# Patient Record
Sex: Female | Born: 1935 | Race: White | Hispanic: No | Smoking: Never smoker
Health system: Southern US, Community
[De-identification: ages and names within clinical notes are randomized; demographics above are authoritative.]

## PROBLEM LIST (undated history)

## (undated) DIAGNOSIS — H269 Unspecified cataract: Secondary | ICD-10-CM

## (undated) DIAGNOSIS — K2270 Barrett's esophagus without dysplasia: Secondary | ICD-10-CM

## (undated) DIAGNOSIS — I10 Essential (primary) hypertension: Secondary | ICD-10-CM

## (undated) DIAGNOSIS — G4730 Sleep apnea, unspecified: Secondary | ICD-10-CM

## (undated) DIAGNOSIS — E119 Type 2 diabetes mellitus without complications: Secondary | ICD-10-CM

## (undated) DIAGNOSIS — I509 Heart failure, unspecified: Secondary | ICD-10-CM

## (undated) DIAGNOSIS — L0390 Cellulitis, unspecified: Secondary | ICD-10-CM

## (undated) HISTORY — PX: AORTIC VALVE REPLACEMENT: SHX41

## (undated) HISTORY — PX: APPENDECTOMY: SHX54

## (undated) HISTORY — PX: KNEE SURGERY: SHX244

## (undated) HISTORY — PX: ABDOMINAL HYSTERECTOMY: SHX81

## (undated) HISTORY — PX: CHOLECYSTECTOMY: SHX55

---

## 2007-11-16 ENCOUNTER — Encounter: Admission: RE | Admit: 2007-11-16 | Discharge: 2007-11-16 | Payer: Self-pay | Admitting: Ophthalmology

## 2014-08-13 ENCOUNTER — Other Ambulatory Visit: Payer: Self-pay | Admitting: Internal Medicine

## 2016-07-14 ENCOUNTER — Emergency Department (HOSPITAL_BASED_OUTPATIENT_CLINIC_OR_DEPARTMENT_OTHER)
Admission: EM | Admit: 2016-07-14 | Discharge: 2016-07-14 | Disposition: A | Discharge status: Home / Self Care | Payer: Medicare Other | Attending: Emergency Medicine | Admitting: Emergency Medicine

## 2016-07-14 ENCOUNTER — Encounter (HOSPITAL_BASED_OUTPATIENT_CLINIC_OR_DEPARTMENT_OTHER): Payer: Self-pay | Admitting: *Deleted

## 2016-07-14 DIAGNOSIS — L03115 Cellulitis of right lower limb: Secondary | ICD-10-CM | POA: Insufficient documentation

## 2016-07-14 DIAGNOSIS — R21 Rash and other nonspecific skin eruption: Secondary | ICD-10-CM | POA: Diagnosis present

## 2016-07-14 DIAGNOSIS — L03116 Cellulitis of left lower limb: Secondary | ICD-10-CM | POA: Insufficient documentation

## 2016-07-14 DIAGNOSIS — Z7982 Long term (current) use of aspirin: Secondary | ICD-10-CM | POA: Diagnosis not present

## 2016-07-14 DIAGNOSIS — Z7984 Long term (current) use of oral hypoglycemic drugs: Secondary | ICD-10-CM | POA: Diagnosis not present

## 2016-07-14 DIAGNOSIS — I509 Heart failure, unspecified: Secondary | ICD-10-CM | POA: Insufficient documentation

## 2016-07-14 DIAGNOSIS — E119 Type 2 diabetes mellitus without complications: Secondary | ICD-10-CM | POA: Insufficient documentation

## 2016-07-14 DIAGNOSIS — Z79899 Other long term (current) drug therapy: Secondary | ICD-10-CM | POA: Diagnosis not present

## 2016-07-14 DIAGNOSIS — I110 Hypertensive heart disease with heart failure: Secondary | ICD-10-CM | POA: Insufficient documentation

## 2016-07-14 DIAGNOSIS — L03119 Cellulitis of unspecified part of limb: Secondary | ICD-10-CM

## 2016-07-14 HISTORY — DX: Heart failure, unspecified: I50.9

## 2016-07-14 HISTORY — DX: Barrett's esophagus without dysplasia: K22.70

## 2016-07-14 HISTORY — DX: Unspecified cataract: H26.9

## 2016-07-14 HISTORY — DX: Cellulitis, unspecified: L03.90

## 2016-07-14 HISTORY — DX: Essential (primary) hypertension: I10

## 2016-07-14 HISTORY — DX: Type 2 diabetes mellitus without complications: E11.9

## 2016-07-14 HISTORY — DX: Sleep apnea, unspecified: G47.30

## 2016-07-14 LAB — CBC WITH DIFFERENTIAL/PLATELET
BASOS PCT: 0 %
Basophils Absolute: 0 10*3/uL (ref 0.0–0.1)
EOS ABS: 0 10*3/uL (ref 0.0–0.7)
Eosinophils Relative: 0 %
HCT: 42.2 % (ref 36.0–46.0)
HEMOGLOBIN: 14.1 g/dL (ref 12.0–15.0)
Lymphocytes Relative: 8 %
Lymphs Abs: 0.8 10*3/uL (ref 0.7–4.0)
MCH: 29.6 pg (ref 26.0–34.0)
MCHC: 33.4 g/dL (ref 30.0–36.0)
MCV: 88.7 fL (ref 78.0–100.0)
MONOS PCT: 8 %
Monocytes Absolute: 0.7 10*3/uL (ref 0.1–1.0)
NEUTROS PCT: 84 %
Neutro Abs: 7.9 10*3/uL — ABNORMAL HIGH (ref 1.7–7.7)
PLATELETS: 242 10*3/uL (ref 150–400)
RBC: 4.76 MIL/uL (ref 3.87–5.11)
RDW: 15.6 % — ABNORMAL HIGH (ref 11.5–15.5)
WBC: 9.5 10*3/uL (ref 4.0–10.5)

## 2016-07-14 LAB — BASIC METABOLIC PANEL
Anion gap: 8 (ref 5–15)
BUN: 19 mg/dL (ref 6–20)
CALCIUM: 10 mg/dL (ref 8.9–10.3)
CO2: 27 mmol/L (ref 22–32)
CREATININE: 0.88 mg/dL (ref 0.44–1.00)
Chloride: 96 mmol/L — ABNORMAL LOW (ref 101–111)
GFR calc non Af Amer: 58 mL/min — ABNORMAL LOW (ref >60)
Glucose, Bld: 294 mg/dL — ABNORMAL HIGH (ref 65–99)
Potassium: 4.3 mmol/L (ref 3.5–5.1)
SODIUM: 131 mmol/L — AB (ref 135–145)

## 2016-07-14 MED ORDER — DEXTROSE 5 % IV SOLN
1.0000 g | Freq: Once | INTRAVENOUS | Status: AC
  Administered 2016-07-14: 1 g via INTRAVENOUS
  Filled 2016-07-14: qty 10

## 2016-07-14 MED ORDER — CEPHALEXIN 500 MG PO CAPS: 1000.0000 mg | ORAL_CAPSULE | Freq: Two times a day (BID) | ORAL | 0 refills | Status: AC

## 2016-07-14 MED FILL — CEPHALEXIN 500 MG CAPSULE: 500 | 7 days supply | Qty: 28 | Fill #0

## 2016-07-14 NOTE — ED Provider Notes (Signed)
[facility] Provider Note   CSN: [id number] Arrival date & time: 07/14/16  1038     History   Chief Complaint Chief Complaint  Patient presents with  . Rash    HPI Kathryn Richards is a 82 y.o. female.  HPI Patient reports she started developing increased redness of her lower legs 3 days ago. She reports she chronically has lower shotty swelling and some redness around her lower legs above the ankles. Patient has history of chronic venous stasis. She reports she saw her primary provider last Friday and the redness encompassed more of her right leg. She was started on doxycycline. Patient reports today the redness had started developing on the left lower leg as well. She was seen at the nurse care clinic and referred to the emergency department. Patient does not have pain with this. No itching. She reports that the person who checked her today thought she also had rash on her lower abdomen. Patient reports she is very careful with the care of her lower abdomen and pannus folds and has not noted any changes and no pain or itching. Patient denies any generalized constitutional symptoms. No fevers, no chills, no nausea or vomiting. Patient poor she is chronically short of breath due to congestive heart failure. She reports she has been no change in any degree of shortness of breath over the past weekend her week. Past Medical History:  Diagnosis Date  . Barrett esophagus   . Cataract   . Cellulitis   . CHF (congestive heart failure) (HCC)   . Diabetes mellitus without complication (HCC)   . Hypertension   . Sleep apnea     There are no active problems to display for this patient.   Past Surgical History:  Procedure Laterality Date  . ABDOMINAL HYSTERECTOMY    . AORTIC VALVE REPLACEMENT    . APPENDECTOMY    . CHOLECYSTECTOMY    . KNEE SURGERY      OB History    No data available       Home Medications    Prior to Admission medications   Medication Sig Start Date  End Date Taking? Authorizing Provider  Aspirin (ASPIR-81 PO) Take by mouth.   Yes Historical Provider, MD  cholecalciferol (VITAMIN D) 1000 units tablet Take 1,000 Units by mouth daily.   Yes Historical Provider, MD  GLIPIZIDE PO Take by mouth.   Yes Historical Provider, MD  losartan (COZAAR) 50 MG tablet Take 50 mg by mouth daily.   Yes Historical Provider, MD  METFORMIN HCL PO Take by mouth.   Yes Historical Provider, MD  metoprolol succinate (TOPROL-XL) 50 MG 24 hr tablet Take 50 mg by mouth daily. Take with or immediately following a meal.   Yes Historical Provider, MD  Multiple Vitamin (MULTIVITAMIN) tablet Take 1 tablet by mouth daily.   Yes Historical Provider, MD  nisoldipine (SULAR) 25.5 MG 24 hr tablet Take 25.5 mg by mouth daily.   Yes Historical Provider, MD  pantoprazole (PROTONIX) 40 MG tablet Take 40 mg by mouth daily.   Yes Historical Provider, MD  rosuvastatin (CRESTOR) 10 MG tablet Take 10 mg by mouth daily.   Yes Historical Provider, MD  SITagliptin Phosphate (JANUVIA PO) Take by mouth.   Yes Historical Provider, MD  spironolactone (ALDACTONE) 25 MG tablet Take 25 mg by mouth daily.   Yes Historical Provider, MD  torsemide (DEMADEX) 20 MG tablet Take 20 mg by mouth daily.   Yes Historical Provider, MD  cephALEXin (  KEFLEX) 500 MG capsule Take 2 capsules (1,000 mg total) by mouth 2 (two) times daily. 07/14/16   Arby BarretteMarcy Loy Mccartt, MD  clopidogrel (PLAVIX) 75 MG tablet TAKE 1 TABLET DAILY 08/14/14   Sharon SellerJessica K Eubanks, NP    Family History No family history on file.  Social History Social History  Substance Use Topics  . Smoking status: Never Smoker  . Smokeless tobacco: Never Used  . Alcohol use No     Allergies   Bactrim [sulfamethoxazole-trimethoprim]; Cortisone; Morphine and related; and Norvasc [amlodipine besylate]   Review of Systems Review of Systems 10 Systems reviewed and are negative for acute change except as noted in the HPI.   Physical Exam Updated Vital  Signs BP 113/61   Pulse 92   Temp 97.8 F (36.6 C) (Oral)   Resp 22   Ht 5\' 4"  (1.626 m)   Wt (!) 313 lb (142 kg)   SpO2 93%   BMI 53.73 kg/m   Physical Exam  Constitutional: She is oriented to person, place, and time.  Patient is alert and nontoxic. Mental status is clear. Morbid obesity.  HENT:  Head: Normocephalic and atraumatic.  Eyes: EOM are normal.  Cardiovascular: Normal rate, regular rhythm, normal heart sounds and intact distal pulses.   Pulmonary/Chest: Effort normal and breath sounds normal.  Abdominal:  Patient's abdomen is nontender. Examination of the pannus folds shows them to be in good condition. There is no maceration, erythema, discharge or rash.  Musculoskeletal:  Patient has 2+ pitting edema bilateral lower extremity's. The skin changes and cobbling consistent with chronic venous stasis. No open wounds or serous exudate at this time. Patient does have diffuse erythema from above the ankles to the high, lower leg. This is blanching.  Neurological: She is alert and oriented to person, place, and time. She exhibits normal muscle tone. Coordination normal.  Skin: Skin is warm and dry.  Psychiatric: She has a normal mood and affect.     ED Treatments / Results  Labs (all labs ordered are listed, but only abnormal results are displayed) Labs Reviewed  BASIC METABOLIC PANEL - Abnormal; Notable for the following:       Result Value   Sodium 131 (*)    Chloride 96 (*)    Glucose, Bld 294 (*)    GFR calc non Af Amer 58 (*)    All other components within normal limits  CBC WITH DIFFERENTIAL/PLATELET - Abnormal; Notable for the following:    RDW 15.6 (*)    Neutro Abs 7.9 (*)    All other components within normal limits    EKG  EKG Interpretation None       Radiology No results found.  Procedures Procedures (including critical care time)  Medications Ordered in ED Medications  cefTRIAXone (ROCEPHIN) 1 g in dextrose 5 % 50 mL IVPB (0 g  Intravenous Stopped 07/14/16 1216)     Initial Impression / Assessment and Plan / ED Course  I have reviewed the triage vital signs and the nursing notes.  Pertinent labs & imaging results that were available during my care of the patient were reviewed by me and considered in my medical decision making (see chart for details).  Clinical Course     Final Clinical Impressions(s) / ED Diagnoses   Final diagnoses:  Cellulitis of lower extremity, unspecified laterality  Patient has underlying chronic venous stasis with some degree of chronic erythema. By history this is spread from being a bandlike concentrated area just above the  ankles to come to sitting most of the lower leg on the right and now having started on the left. Clinically, the patient is in good condition. She is alert and appropriate. Vital signs are stable. No fever, leukocytosis or symptoms of general malaise. At this time, she has been given one IV dose of Rocephin and Keflex will be added to her antibiotic. I will have the patient continue her doxycycline as prescribed. No evidence of allergic reaction.  New Prescriptions New Prescriptions   CEPHALEXIN (KEFLEX) 500 MG CAPSULE    Take 2 capsules (1,000 mg total) by mouth 2 (two) times daily.     Arby BarretteMarcy Aryaa Bunting, MD 07/14/16 [phone]

## 2016-07-14 NOTE — ED Triage Notes (Signed)
Rash on both of her lower legs. It started on her right leg 3 days ago. She was started on Doxycycline. Rash on the left leg the 2 days ago. She now has a rash on her abdomen. No itching.

## 2016-07-14 NOTE — ED Notes (Signed)
ED Provider at bedside. 

## 2017-05-15 ENCOUNTER — Inpatient Hospital Stay
Admission: EM | Admit: 2017-05-15 | Discharge: 2017-06-11 | Disposition: E | Payer: Medicare Other | Source: Other Acute Inpatient Hospital | Attending: Internal Medicine | Admitting: Internal Medicine

## 2017-05-15 DIAGNOSIS — J9690 Respiratory failure, unspecified, unspecified whether with hypoxia or hypercapnia: Secondary | ICD-10-CM

## 2017-05-15 DIAGNOSIS — Z4659 Encounter for fitting and adjustment of other gastrointestinal appliance and device: Secondary | ICD-10-CM

## 2017-05-15 DIAGNOSIS — J111 Influenza due to unidentified influenza virus with other respiratory manifestations: Secondary | ICD-10-CM

## 2017-05-15 DIAGNOSIS — Z452 Encounter for adjustment and management of vascular access device: Secondary | ICD-10-CM

## 2017-05-15 DIAGNOSIS — I509 Heart failure, unspecified: Secondary | ICD-10-CM

## 2017-05-15 DIAGNOSIS — J189 Pneumonia, unspecified organism: Secondary | ICD-10-CM

## 2017-05-15 DIAGNOSIS — J9601 Acute respiratory failure with hypoxia: Secondary | ICD-10-CM

## 2017-05-15 DIAGNOSIS — Z978 Presence of other specified devices: Secondary | ICD-10-CM

## 2017-05-15 LAB — CBC WITH DIFFERENTIAL/PLATELET
BASOS ABS: 0 10*3/uL (ref 0.0–0.1)
Basophils Relative: 0 %
EOS ABS: 0.2 10*3/uL (ref 0.0–0.7)
Eosinophils Relative: 2 %
HCT: 40.8 % (ref 36.0–46.0)
HEMOGLOBIN: 12.5 g/dL (ref 12.0–15.0)
LYMPHS ABS: 0.6 10*3/uL — AB (ref 0.7–4.0)
Lymphocytes Relative: 6 %
MCH: 27.6 pg (ref 26.0–34.0)
MCHC: 30.6 g/dL (ref 30.0–36.0)
MCV: 90.1 fL (ref 78.0–100.0)
Monocytes Absolute: 1.1 10*3/uL — ABNORMAL HIGH (ref 0.1–1.0)
Monocytes Relative: 11 %
Neutro Abs: 7.8 10*3/uL — ABNORMAL HIGH (ref 1.7–7.7)
Neutrophils Relative %: 81 %
PLATELETS: 195 10*3/uL (ref 150–400)
RBC: 4.53 MIL/uL (ref 3.87–5.11)
RDW: 16.3 % — ABNORMAL HIGH (ref 11.5–15.5)
WBC: 9.6 10*3/uL (ref 4.0–10.5)

## 2017-05-15 LAB — COMPREHENSIVE METABOLIC PANEL
ALT: 20 U/L (ref 14–54)
AST: 23 U/L (ref 15–41)
Albumin: 2.5 g/dL — ABNORMAL LOW (ref 3.5–5.0)
Alkaline Phosphatase: 62 U/L (ref 38–126)
Anion gap: 9 (ref 5–15)
BILIRUBIN TOTAL: 0.5 mg/dL (ref 0.3–1.2)
BUN: 15 mg/dL (ref 6–20)
CALCIUM: 9.7 mg/dL (ref 8.9–10.3)
CHLORIDE: 98 mmol/L — AB (ref 101–111)
CO2: 30 mmol/L (ref 22–32)
CREATININE: 0.85 mg/dL (ref 0.44–1.00)
Glucose, Bld: 171 mg/dL — ABNORMAL HIGH (ref 65–99)
Potassium: 4.3 mmol/L (ref 3.5–5.1)
Sodium: 137 mmol/L (ref 135–145)
TOTAL PROTEIN: 5.8 g/dL — AB (ref 6.5–8.1)

## 2017-05-16 ENCOUNTER — Other Ambulatory Visit (HOSPITAL_COMMUNITY): Payer: Medicare Other

## 2017-05-18 LAB — BASIC METABOLIC PANEL
ANION GAP: 6 (ref 5–15)
BUN: 18 mg/dL (ref 6–20)
CALCIUM: 9.9 mg/dL (ref 8.9–10.3)
CO2: 36 mmol/L — AB (ref 22–32)
Chloride: 95 mmol/L — ABNORMAL LOW (ref 101–111)
Creatinine, Ser: 0.97 mg/dL (ref 0.44–1.00)
GFR calc Af Amer: 60 mL/min — ABNORMAL LOW (ref >60)
GFR, EST NON AFRICAN AMERICAN: 51 mL/min — AB (ref >60)
GLUCOSE: 161 mg/dL — AB (ref 65–99)
Potassium: 4.1 mmol/L (ref 3.5–5.1)
Sodium: 137 mmol/L (ref 135–145)

## 2017-05-20 LAB — BLOOD GAS, ARTERIAL
ACID-BASE EXCESS: 13.5 mmol/L — AB (ref 0.0–2.0)
BICARBONATE: 40.2 mmol/L — AB (ref 20.0–28.0)
FIO2: 75
O2 Content: 35 L/min
O2 SAT: 93.4 %
PATIENT TEMPERATURE: 98.6
pCO2 arterial: 84 mmHg (ref 32.0–48.0)
pH, Arterial: 7.302 — ABNORMAL LOW (ref 7.350–7.450)
pO2, Arterial: 75.4 mmHg — ABNORMAL LOW (ref 83.0–108.0)

## 2017-05-21 LAB — BASIC METABOLIC PANEL
ANION GAP: 7 (ref 5–15)
BUN: 21 mg/dL — ABNORMAL HIGH (ref 6–20)
CALCIUM: 11 mg/dL — AB (ref 8.9–10.3)
CO2: 40 mmol/L — ABNORMAL HIGH (ref 22–32)
Chloride: 92 mmol/L — ABNORMAL LOW (ref 101–111)
Creatinine, Ser: 1.05 mg/dL — ABNORMAL HIGH (ref 0.44–1.00)
GFR calc Af Amer: 54 mL/min — ABNORMAL LOW (ref >60)
GFR, EST NON AFRICAN AMERICAN: 47 mL/min — AB (ref >60)
Glucose, Bld: 210 mg/dL — ABNORMAL HIGH (ref 65–99)
POTASSIUM: 4.3 mmol/L (ref 3.5–5.1)
SODIUM: 139 mmol/L (ref 135–145)

## 2017-05-21 LAB — BLOOD GAS, ARTERIAL
Acid-Base Excess: 15.2 mmol/L — ABNORMAL HIGH (ref 0.0–2.0)
Bicarbonate: 42.1 mmol/L — ABNORMAL HIGH (ref 20.0–28.0)
Delivery systems: POSITIVE
EXPIRATORY PAP: 8
FIO2: 75
INSPIRATORY PAP: 16
MODE: POSITIVE
O2 SAT: 94.3 %
PATIENT TEMPERATURE: 98.6
PO2 ART: 77.8 mmHg — AB (ref 83.0–108.0)
pCO2 arterial: 87.3 mmHg (ref 32.0–48.0)
pH, Arterial: 7.305 — ABNORMAL LOW (ref 7.350–7.450)

## 2017-05-22 ENCOUNTER — Other Ambulatory Visit (HOSPITAL_COMMUNITY): Payer: Medicare Other

## 2017-05-22 LAB — BASIC METABOLIC PANEL
ANION GAP: 8 (ref 5–15)
BUN: 25 mg/dL — ABNORMAL HIGH (ref 6–20)
CHLORIDE: 93 mmol/L — AB (ref 101–111)
CO2: 39 mmol/L — AB (ref 22–32)
Calcium: 11 mg/dL — ABNORMAL HIGH (ref 8.9–10.3)
Creatinine, Ser: 0.94 mg/dL (ref 0.44–1.00)
GFR calc Af Amer: >60 mL/min (ref >60)
GFR calc non Af Amer: 53 mL/min — ABNORMAL LOW (ref >60)
Glucose, Bld: 257 mg/dL — ABNORMAL HIGH (ref 65–99)
Potassium: 4 mmol/L (ref 3.5–5.1)
Sodium: 140 mmol/L (ref 135–145)

## 2017-05-24 ENCOUNTER — Other Ambulatory Visit (HOSPITAL_COMMUNITY): Payer: Medicare Other

## 2017-05-25 ENCOUNTER — Other Ambulatory Visit (HOSPITAL_COMMUNITY): Payer: Medicare Other

## 2017-05-25 LAB — CBC
HCT: 41.4 % (ref 36.0–46.0)
Hemoglobin: 12.6 g/dL (ref 12.0–15.0)
MCH: 27.5 pg (ref 26.0–34.0)
MCHC: 30.4 g/dL (ref 30.0–36.0)
MCV: 90.2 fL (ref 78.0–100.0)
PLATELETS: 273 10*3/uL (ref 150–400)
RBC: 4.59 MIL/uL (ref 3.87–5.11)
RDW: 16.4 % — AB (ref 11.5–15.5)
WBC: 15.3 10*3/uL — AB (ref 4.0–10.5)

## 2017-05-25 LAB — BASIC METABOLIC PANEL
Anion gap: 11 (ref 5–15)
BUN: 60 mg/dL — AB (ref 6–20)
CALCIUM: 11.2 mg/dL — AB (ref 8.9–10.3)
CO2: 39 mmol/L — ABNORMAL HIGH (ref 22–32)
CREATININE: 1.13 mg/dL — AB (ref 0.44–1.00)
Chloride: 91 mmol/L — ABNORMAL LOW (ref 101–111)
GFR calc Af Amer: 50 mL/min — ABNORMAL LOW (ref >60)
GFR, EST NON AFRICAN AMERICAN: 43 mL/min — AB (ref >60)
GLUCOSE: 318 mg/dL — AB (ref 65–99)
Potassium: 3.9 mmol/L (ref 3.5–5.1)
SODIUM: 141 mmol/L (ref 135–145)

## 2017-05-26 LAB — TRIGLYCERIDES: Triglycerides: 249 mg/dL — ABNORMAL HIGH (ref <150)

## 2017-05-27 ENCOUNTER — Other Ambulatory Visit (HOSPITAL_COMMUNITY): Payer: Medicare Other

## 2017-05-27 LAB — COMPREHENSIVE METABOLIC PANEL
ALK PHOS: 60 U/L (ref 38–126)
ALT: 24 U/L (ref 14–54)
AST: 19 U/L (ref 15–41)
Albumin: 2.5 g/dL — ABNORMAL LOW (ref 3.5–5.0)
Anion gap: 9 (ref 5–15)
BUN: 62 mg/dL — ABNORMAL HIGH (ref 6–20)
CALCIUM: 10.7 mg/dL — AB (ref 8.9–10.3)
CO2: 37 mmol/L — ABNORMAL HIGH (ref 22–32)
CREATININE: 0.9 mg/dL (ref 0.44–1.00)
Chloride: 103 mmol/L (ref 101–111)
GFR, EST NON AFRICAN AMERICAN: 56 mL/min — AB (ref >60)
Glucose, Bld: 499 mg/dL — ABNORMAL HIGH (ref 65–99)
Potassium: 3.7 mmol/L (ref 3.5–5.1)
Sodium: 149 mmol/L — ABNORMAL HIGH (ref 135–145)
Total Bilirubin: 0.7 mg/dL (ref 0.3–1.2)
Total Protein: 5.8 g/dL — ABNORMAL LOW (ref 6.5–8.1)

## 2017-05-27 LAB — BLOOD GAS, ARTERIAL
Acid-Base Excess: 16.9 mmol/L — ABNORMAL HIGH (ref 0.0–2.0)
Bicarbonate: 42.8 mmol/L — ABNORMAL HIGH (ref 20.0–28.0)
FIO2: 100
MECHVT: 450 mL
O2 Saturation: 87 %
PEEP: 8 cmH2O
PO2 ART: 57.8 mmHg — AB (ref 83.0–108.0)
Patient temperature: 98.6
RATE: 18 resp/min
pCO2 arterial: 71.2 mmHg (ref 32.0–48.0)
pH, Arterial: 7.396 (ref 7.350–7.450)

## 2017-05-27 LAB — PHOSPHORUS: PHOSPHORUS: 1.9 mg/dL — AB (ref 2.5–4.6)

## 2017-05-27 LAB — MAGNESIUM: MAGNESIUM: 2.4 mg/dL (ref 1.7–2.4)

## 2017-05-28 ENCOUNTER — Other Ambulatory Visit (HOSPITAL_COMMUNITY): Payer: Medicare Other

## 2017-05-28 LAB — BASIC METABOLIC PANEL
Anion gap: 8 (ref 5–15)
BUN: 81 mg/dL — AB (ref 6–20)
CALCIUM: 10.4 mg/dL — AB (ref 8.9–10.3)
CHLORIDE: 102 mmol/L (ref 101–111)
CO2: 39 mmol/L — AB (ref 22–32)
CREATININE: 1.27 mg/dL — AB (ref 0.44–1.00)
GFR calc Af Amer: 43 mL/min — ABNORMAL LOW (ref >60)
GFR calc non Af Amer: 37 mL/min — ABNORMAL LOW (ref >60)
Glucose, Bld: 400 mg/dL — ABNORMAL HIGH (ref 65–99)
Potassium: 3.9 mmol/L (ref 3.5–5.1)
SODIUM: 149 mmol/L — AB (ref 135–145)

## 2017-05-28 LAB — MAGNESIUM: Magnesium: 2.4 mg/dL (ref 1.7–2.4)

## 2017-05-28 LAB — GLUCOSE, RANDOM: Glucose, Bld: 545 mg/dL (ref 65–99)

## 2017-05-29 LAB — CBC
HCT: 32.4 % — ABNORMAL LOW (ref 36.0–46.0)
Hemoglobin: 10.3 g/dL — ABNORMAL LOW (ref 12.0–15.0)
MCH: 27.9 pg (ref 26.0–34.0)
MCHC: 31.8 g/dL (ref 30.0–36.0)
MCV: 87.8 fL (ref 78.0–100.0)
PLATELETS: 183 10*3/uL (ref 150–400)
RBC: 3.69 MIL/uL — AB (ref 3.87–5.11)
RDW: 17.3 % — AB (ref 11.5–15.5)
WBC: 18.9 10*3/uL — AB (ref 4.0–10.5)

## 2017-05-29 LAB — BASIC METABOLIC PANEL
Anion gap: 9 (ref 5–15)
BUN: 83 mg/dL — AB (ref 6–20)
CALCIUM: 10.1 mg/dL (ref 8.9–10.3)
CO2: 35 mmol/L — ABNORMAL HIGH (ref 22–32)
CREATININE: 1.35 mg/dL — AB (ref 0.44–1.00)
Chloride: 105 mmol/L (ref 101–111)
GFR calc Af Amer: 40 mL/min — ABNORMAL LOW (ref >60)
GFR, EST NON AFRICAN AMERICAN: 34 mL/min — AB (ref >60)
Glucose, Bld: 389 mg/dL — ABNORMAL HIGH (ref 65–99)
Potassium: 3.8 mmol/L (ref 3.5–5.1)
Sodium: 149 mmol/L — ABNORMAL HIGH (ref 135–145)

## 2017-05-30 ENCOUNTER — Other Ambulatory Visit (HOSPITAL_COMMUNITY): Payer: Medicare Other

## 2017-05-30 LAB — BASIC METABOLIC PANEL
ANION GAP: 8 (ref 5–15)
BUN: 75 mg/dL — ABNORMAL HIGH (ref 6–20)
CALCIUM: 10.2 mg/dL (ref 8.9–10.3)
CO2: 38 mmol/L — AB (ref 22–32)
CREATININE: 1.24 mg/dL — AB (ref 0.44–1.00)
Chloride: 103 mmol/L (ref 101–111)
GFR, EST AFRICAN AMERICAN: 44 mL/min — AB (ref >60)
GFR, EST NON AFRICAN AMERICAN: 38 mL/min — AB (ref >60)
Glucose, Bld: 351 mg/dL — ABNORMAL HIGH (ref 65–99)
Potassium: 3.3 mmol/L — ABNORMAL LOW (ref 3.5–5.1)
SODIUM: 149 mmol/L — AB (ref 135–145)

## 2017-05-30 LAB — TSH: TSH: 0.117 u[IU]/mL — AB (ref 0.350–4.500)

## 2017-05-30 LAB — VANCOMYCIN, TROUGH: Vancomycin Tr: 10 ug/mL — ABNORMAL LOW (ref 15–20)

## 2017-05-31 ENCOUNTER — Other Ambulatory Visit (HOSPITAL_COMMUNITY): Payer: Medicare Other

## 2017-05-31 LAB — BLOOD GAS, ARTERIAL
Acid-Base Excess: 15.1 mmol/L — ABNORMAL HIGH (ref 0.0–2.0)
Acid-Base Excess: 15.5 mmol/L — ABNORMAL HIGH (ref 0.0–2.0)
BICARBONATE: 38.9 mmol/L — AB (ref 20.0–28.0)
Bicarbonate: 38.9 mmol/L — ABNORMAL HIGH (ref 20.0–28.0)
FIO2: 100
FIO2: 100
LHR: 30 {breaths}/min
O2 SAT: 90.6 %
O2 Saturation: 96.1 %
PATIENT TEMPERATURE: 98.6
PCO2 ART: 41.8 mmHg (ref 32.0–48.0)
PEEP/CPAP: 12 cmH2O
PO2 ART: 57 mmHg — AB (ref 83.0–108.0)
PO2 ART: 75.5 mmHg — AB (ref 83.0–108.0)
Patient temperature: 98.6
Pressure control: 18 cmH2O
Pressure control: 26 cmH2O
RATE: 18 resp/min
pCO2 arterial: 38.1 mmHg (ref 32.0–48.0)
pH, Arterial: 7.575 — ABNORMAL HIGH (ref 7.350–7.450)
pH, Arterial: 7.613 (ref 7.350–7.450)

## 2017-05-31 LAB — BASIC METABOLIC PANEL
Anion gap: 10 (ref 5–15)
BUN: 87 mg/dL — ABNORMAL HIGH (ref 6–20)
CHLORIDE: 104 mmol/L (ref 101–111)
CO2: 35 mmol/L — AB (ref 22–32)
CREATININE: 1.43 mg/dL — AB (ref 0.44–1.00)
Calcium: 9.7 mg/dL (ref 8.9–10.3)
GFR calc Af Amer: 37 mL/min — ABNORMAL LOW (ref >60)
GFR calc non Af Amer: 32 mL/min — ABNORMAL LOW (ref >60)
Glucose, Bld: 302 mg/dL — ABNORMAL HIGH (ref 65–99)
POTASSIUM: 3.3 mmol/L — AB (ref 3.5–5.1)
SODIUM: 149 mmol/L — AB (ref 135–145)

## 2017-05-31 LAB — MAGNESIUM: MAGNESIUM: 2.4 mg/dL (ref 1.7–2.4)

## 2017-06-01 LAB — BASIC METABOLIC PANEL
Anion gap: 11 (ref 5–15)
BUN: 87 mg/dL — AB (ref 6–20)
CO2: 36 mmol/L — AB (ref 22–32)
CREATININE: 1.34 mg/dL — AB (ref 0.44–1.00)
Calcium: 9.6 mg/dL (ref 8.9–10.3)
Chloride: 105 mmol/L (ref 101–111)
GFR calc Af Amer: 40 mL/min — ABNORMAL LOW (ref >60)
GFR calc non Af Amer: 35 mL/min — ABNORMAL LOW (ref >60)
Glucose, Bld: 290 mg/dL — ABNORMAL HIGH (ref 65–99)
Potassium: 3.3 mmol/L — ABNORMAL LOW (ref 3.5–5.1)
SODIUM: 152 mmol/L — AB (ref 135–145)

## 2017-06-01 LAB — CBC
HCT: 34.8 % — ABNORMAL LOW (ref 36.0–46.0)
Hemoglobin: 10.7 g/dL — ABNORMAL LOW (ref 12.0–15.0)
MCH: 27.2 pg (ref 26.0–34.0)
MCHC: 30.7 g/dL (ref 30.0–36.0)
MCV: 88.5 fL (ref 78.0–100.0)
Platelets: 173 10*3/uL (ref 150–400)
RBC: 3.93 MIL/uL (ref 3.87–5.11)
RDW: 17.3 % — ABNORMAL HIGH (ref 11.5–15.5)
WBC: 32.2 10*3/uL — ABNORMAL HIGH (ref 4.0–10.5)

## 2017-06-01 LAB — MAGNESIUM: MAGNESIUM: 2.5 mg/dL — AB (ref 1.7–2.4)

## 2017-06-02 LAB — BASIC METABOLIC PANEL
Anion gap: 11 (ref 5–15)
BUN: 98 mg/dL — ABNORMAL HIGH (ref 6–20)
CO2: 34 mmol/L — AB (ref 22–32)
Calcium: 10 mg/dL (ref 8.9–10.3)
Chloride: 109 mmol/L (ref 101–111)
Creatinine, Ser: 1.43 mg/dL — ABNORMAL HIGH (ref 0.44–1.00)
GFR calc Af Amer: 37 mL/min — ABNORMAL LOW (ref >60)
GFR, EST NON AFRICAN AMERICAN: 32 mL/min — AB (ref >60)
GLUCOSE: 190 mg/dL — AB (ref 65–99)
POTASSIUM: 3.9 mmol/L (ref 3.5–5.1)
Sodium: 154 mmol/L — ABNORMAL HIGH (ref 135–145)

## 2017-06-02 LAB — CBC WITH DIFFERENTIAL/PLATELET
BASOS ABS: 0 10*3/uL (ref 0.0–0.1)
BASOS PCT: 0 %
EOS PCT: 0 %
Eosinophils Absolute: 0 10*3/uL (ref 0.0–0.7)
HEMATOCRIT: 37.1 % (ref 36.0–46.0)
HEMOGLOBIN: 11.6 g/dL — AB (ref 12.0–15.0)
LYMPHS ABS: 1.1 10*3/uL (ref 0.7–4.0)
LYMPHS PCT: 2 %
MCH: 27.8 pg (ref 26.0–34.0)
MCHC: 31.3 g/dL (ref 30.0–36.0)
MCV: 88.8 fL (ref 78.0–100.0)
MONOS PCT: 1 %
Monocytes Absolute: 0.6 10*3/uL (ref 0.1–1.0)
NEUTROS ABS: 55.1 10*3/uL — AB (ref 1.7–7.7)
Neutrophils Relative %: 97 %
Platelets: 228 10*3/uL (ref 150–400)
RBC: 4.18 MIL/uL (ref 3.87–5.11)
RDW: 17.8 % — ABNORMAL HIGH (ref 11.5–15.5)
WBC: 56.8 10*3/uL (ref 4.0–10.5)

## 2017-06-02 LAB — MAGNESIUM: Magnesium: 2.7 mg/dL — ABNORMAL HIGH (ref 1.7–2.4)

## 2017-06-03 LAB — BASIC METABOLIC PANEL
ANION GAP: 17 — AB (ref 5–15)
BUN: 111 mg/dL — AB (ref 6–20)
CHLORIDE: 104 mmol/L (ref 101–111)
CO2: 31 mmol/L (ref 22–32)
CREATININE: 1.68 mg/dL — AB (ref 0.44–1.00)
Calcium: 9.6 mg/dL (ref 8.9–10.3)
GFR calc non Af Amer: 26 mL/min — ABNORMAL LOW (ref >60)
GFR, EST AFRICAN AMERICAN: 31 mL/min — AB (ref >60)
Glucose, Bld: 268 mg/dL — ABNORMAL HIGH (ref 65–99)
Potassium: 4.3 mmol/L (ref 3.5–5.1)
SODIUM: 152 mmol/L — AB (ref 135–145)

## 2017-06-03 LAB — LACTIC ACID, PLASMA: Lactic Acid, Venous: 2.4 mmol/L (ref 0.5–1.9)

## 2017-06-03 LAB — CBC WITH DIFFERENTIAL/PLATELET
Basophils Absolute: 0 10*3/uL (ref 0.0–0.1)
Basophils Relative: 0 %
EOS PCT: 0 %
Eosinophils Absolute: 0 10*3/uL (ref 0.0–0.7)
HEMATOCRIT: 37.3 % (ref 36.0–46.0)
Hemoglobin: 11.7 g/dL — ABNORMAL LOW (ref 12.0–15.0)
Lymphocytes Relative: 1 %
Lymphs Abs: 0.5 10*3/uL — ABNORMAL LOW (ref 0.7–4.0)
MCH: 27.9 pg (ref 26.0–34.0)
MCHC: 31.4 g/dL (ref 30.0–36.0)
MCV: 89 fL (ref 78.0–100.0)
MONO ABS: 0.5 10*3/uL (ref 0.1–1.0)
MONOS PCT: 1 %
NEUTROS PCT: 98 %
Neutro Abs: 47.8 10*3/uL — ABNORMAL HIGH (ref 1.7–7.7)
PLATELETS: 210 10*3/uL (ref 150–400)
RBC: 4.19 MIL/uL (ref 3.87–5.11)
RDW: 18.2 % — ABNORMAL HIGH (ref 11.5–15.5)
WBC: 48.8 10*3/uL — ABNORMAL HIGH (ref 4.0–10.5)

## 2017-06-04 LAB — CBC WITH DIFFERENTIAL/PLATELET
Basophils Absolute: 0 10*3/uL (ref 0.0–0.1)
Basophils Relative: 0 %
EOS ABS: 0 10*3/uL (ref 0.0–0.7)
Eosinophils Relative: 0 %
HCT: 35.4 % — ABNORMAL LOW (ref 36.0–46.0)
Hemoglobin: 10.8 g/dL — ABNORMAL LOW (ref 12.0–15.0)
Lymphocytes Relative: 2 %
Lymphs Abs: 0.9 10*3/uL (ref 0.7–4.0)
MCH: 27.4 pg (ref 26.0–34.0)
MCHC: 30.5 g/dL (ref 30.0–36.0)
MCV: 89.8 fL (ref 78.0–100.0)
MONO ABS: 0.4 10*3/uL (ref 0.1–1.0)
Monocytes Relative: 1 %
NEUTROS PCT: 97 %
Neutro Abs: 42.1 10*3/uL — ABNORMAL HIGH (ref 1.7–7.7)
PLATELETS: 138 10*3/uL — AB (ref 150–400)
RBC: 3.94 MIL/uL (ref 3.87–5.11)
RDW: 18.4 % — ABNORMAL HIGH (ref 11.5–15.5)
WBC: 43.4 10*3/uL — AB (ref 4.0–10.5)

## 2017-06-04 LAB — BASIC METABOLIC PANEL
ANION GAP: 13 (ref 5–15)
BUN: 113 mg/dL — AB (ref 6–20)
CO2: 34 mmol/L — ABNORMAL HIGH (ref 22–32)
Calcium: 9.1 mg/dL (ref 8.9–10.3)
Chloride: 106 mmol/L (ref 101–111)
Creatinine, Ser: 1.65 mg/dL — ABNORMAL HIGH (ref 0.44–1.00)
GFR calc Af Amer: 31 mL/min — ABNORMAL LOW (ref >60)
GFR calc non Af Amer: 27 mL/min — ABNORMAL LOW (ref >60)
Glucose, Bld: 277 mg/dL — ABNORMAL HIGH (ref 65–99)
POTASSIUM: 3.6 mmol/L (ref 3.5–5.1)
SODIUM: 153 mmol/L — AB (ref 135–145)

## 2017-06-11 DEATH — deceased

## 2018-07-20 IMAGING — DX DG CHEST 1V PORT
2 series · 2 of 2 positions shown · non-contrast
Comparison: Single-view of the chest 05/22/2017 and 05/16/2017.

CLINICAL DATA: Productive cough.

EXAM:
PORTABLE CHEST 1 VIEW

[chest ap (1 of 2)]
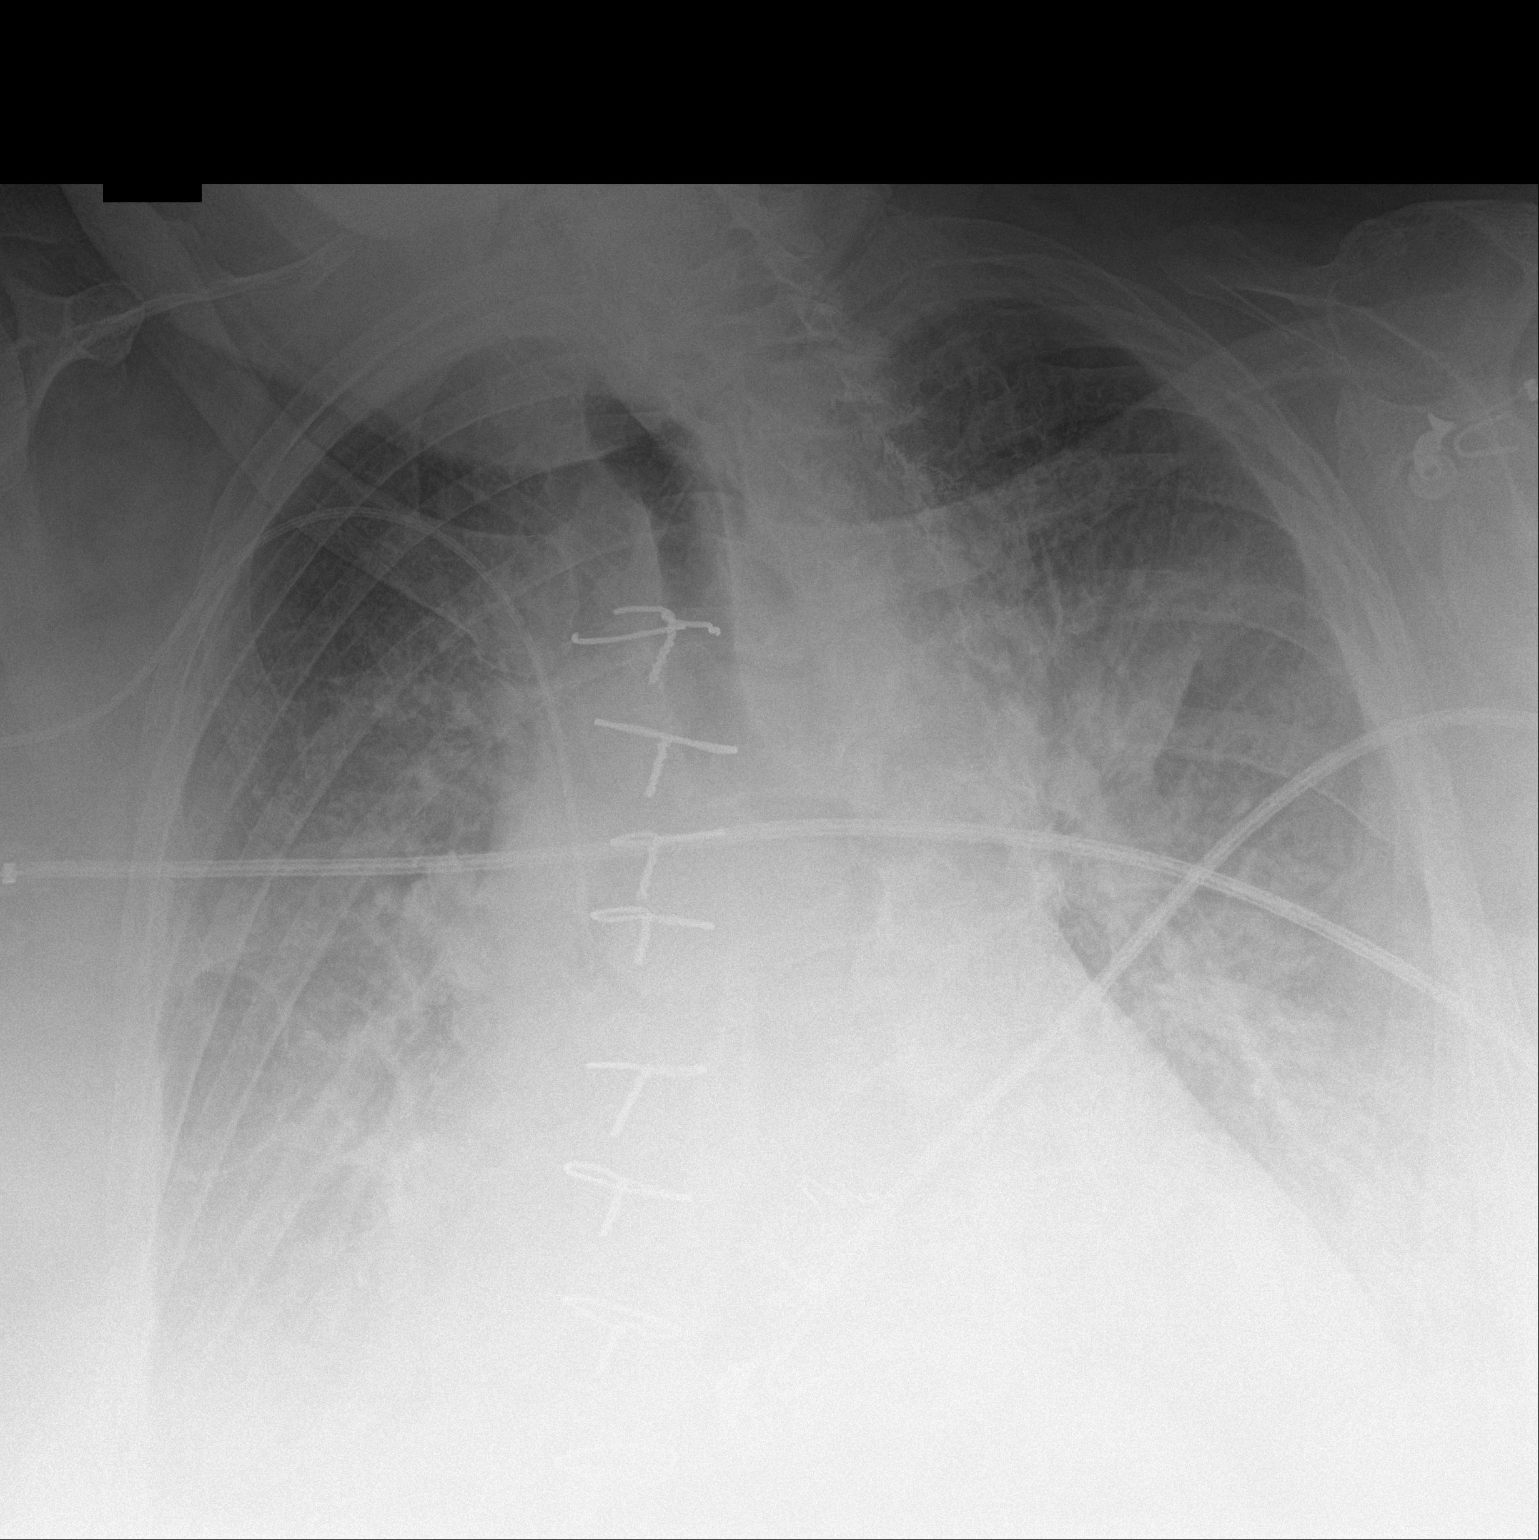

[chest ap (2 of 2)]
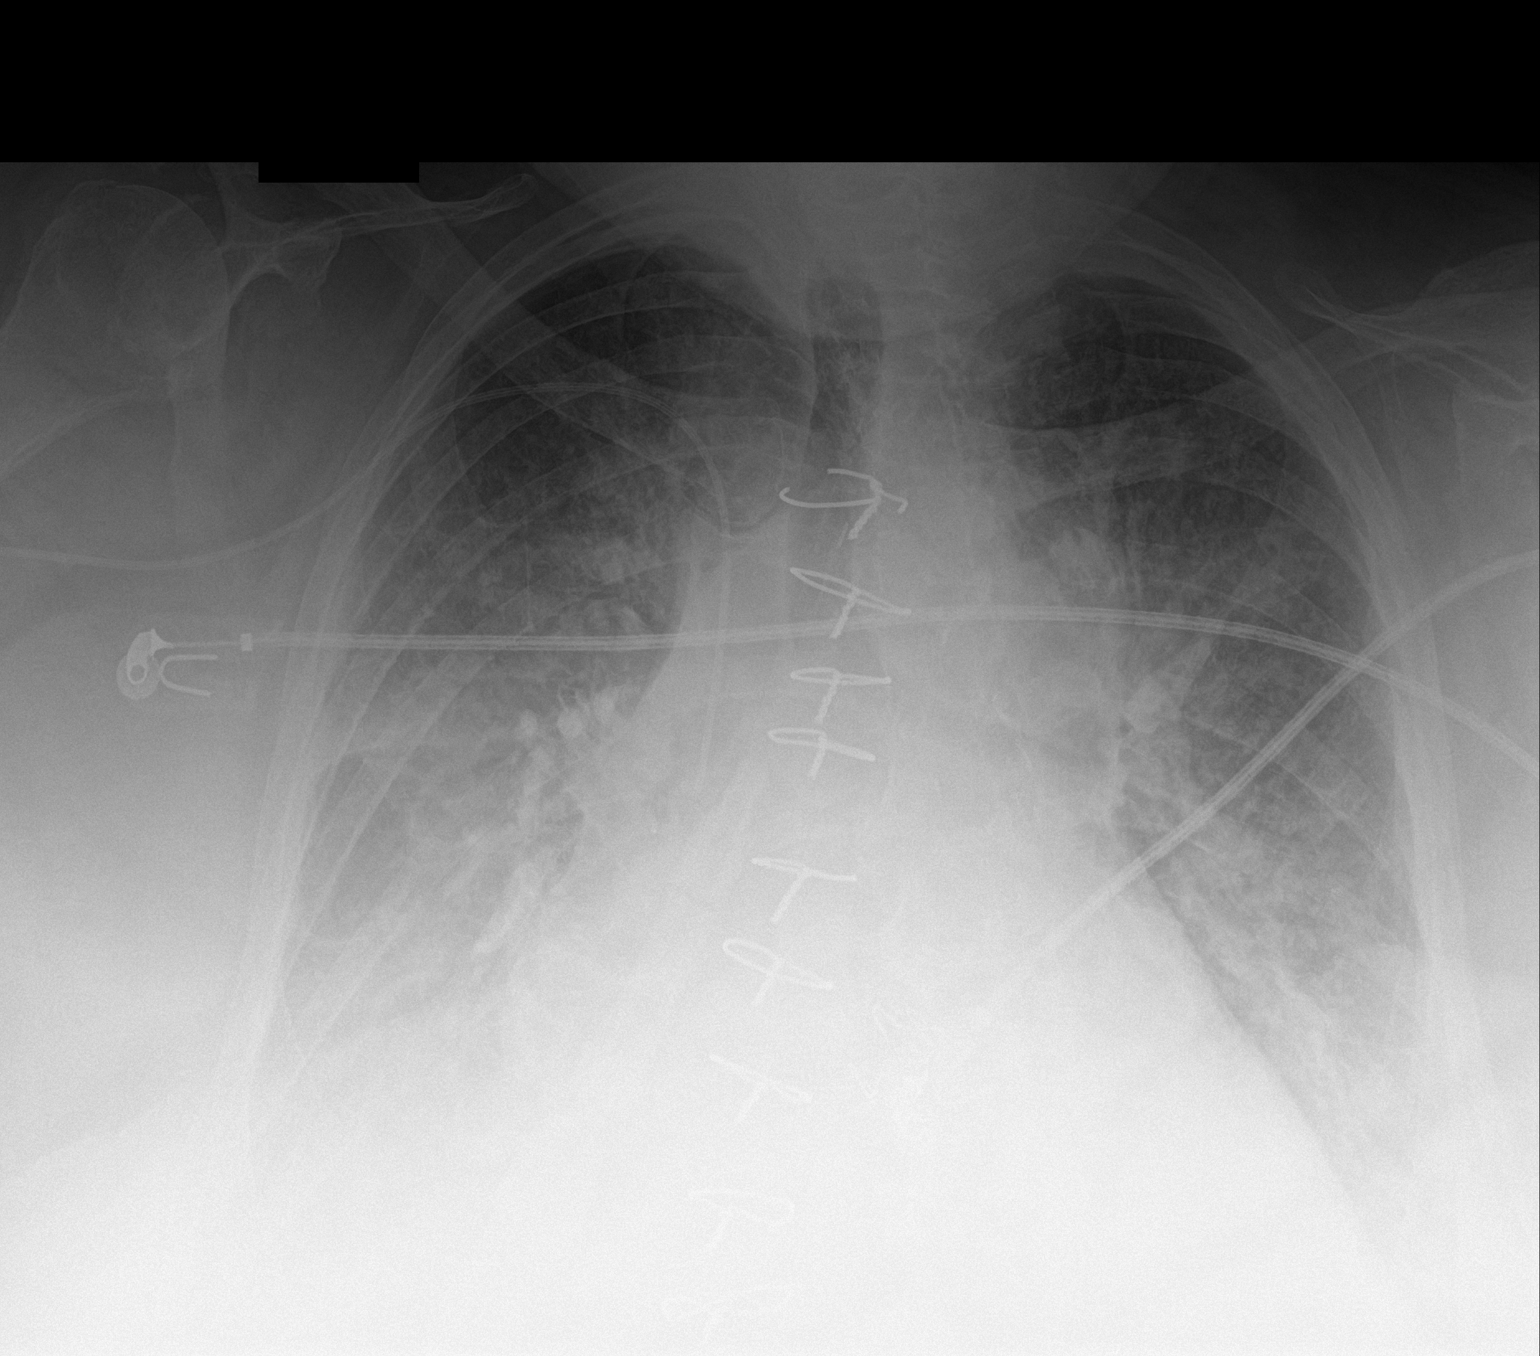

[2 of 2 positions shown; findings below may reference images not displayed]

FINDINGS: Right PICC remains in place. There is cardiomegaly. Extensive
bilateral airspace disease and pleural effusions persist without
notable change. No pneumothorax.
IMPRESSION: No change in extensive bilateral airspace disease and pleural
effusions which could be due to pneumonia and/or pulmonary edema.

## 2018-07-24 IMAGING — DX DG CHEST 1V PORT
1 series · 1 of 1 positions shown · non-contrast
Comparison: Chest x-ray of May 27, 2017

CLINICAL DATA: Tachycardia, decreased oxygen saturation. History of
CHF and aortic valve replacement. Morbid obesity. Intubated patient.

EXAM:
PORTABLE CHEST 1 VIEW

[chest ap]
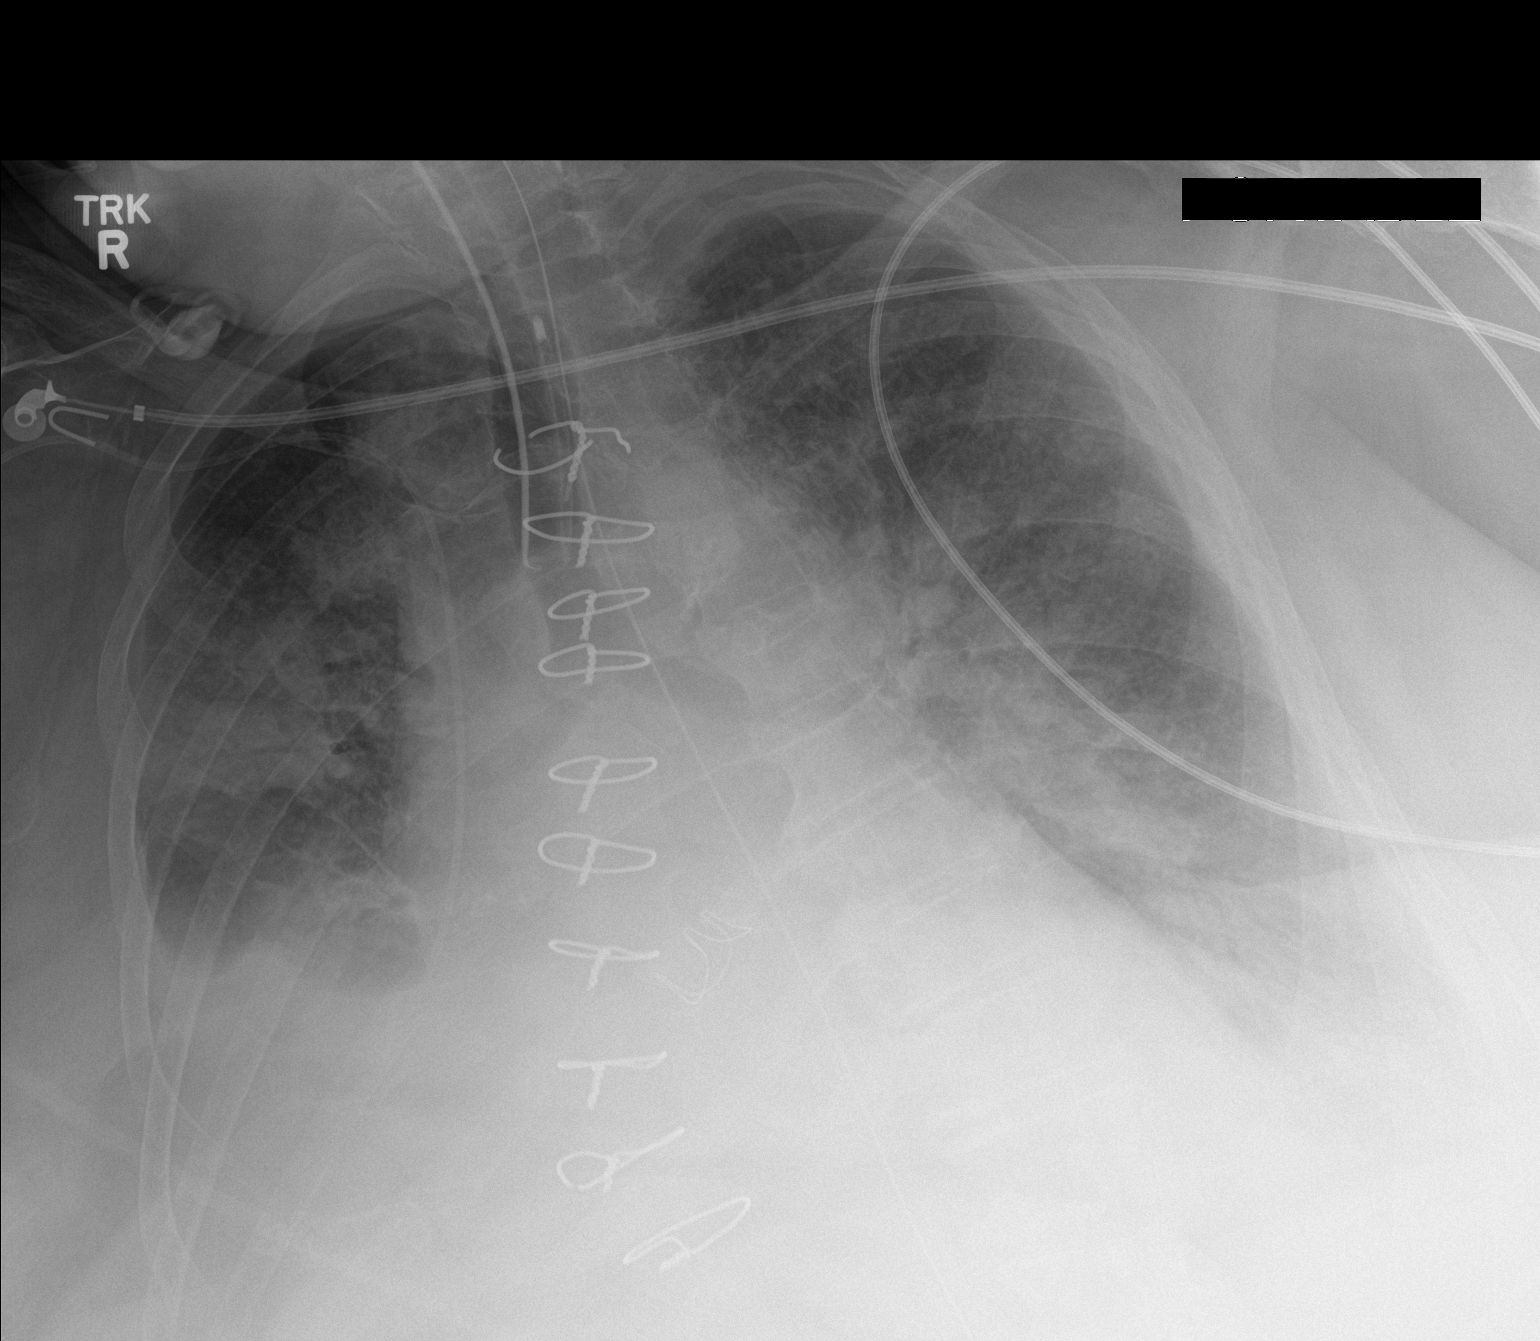

[1 of 1 positions shown; findings below may reference images not displayed]

FINDINGS: The patient is rotated on this study. The lungs remain mildly
hypoinflated. Patchy alveolar opacities are present. There is a
right pleural effusion which appears stable. The cardiac silhouette
remains enlarged. The pulmonary vascularity remains indistinct.
There is calcification in the wall of the aortic arch.
IMPRESSION: Persistent bilateral alveolar opacities most compatible with CHF
though certainly superimposed infection cannot be excluded.
Persistent moderate sized right pleural effusion. The support tubes
are in reasonable position. The endotracheal tube tip lies
approximately 3 cm above the carina. The esophagogastric tube tip
projects below the inferior margin of the image.

Thoracic aortic atherosclerosis.

## 2018-07-27 IMAGING — DX DG CHEST 1V PORT
1 series · 1 of 1 positions shown · non-contrast
Comparison: 05/30/2017 and prior radiographs

CLINICAL DATA: Respiratory failure

EXAM:
PORTABLE CHEST 1 VIEW

[chest ap]
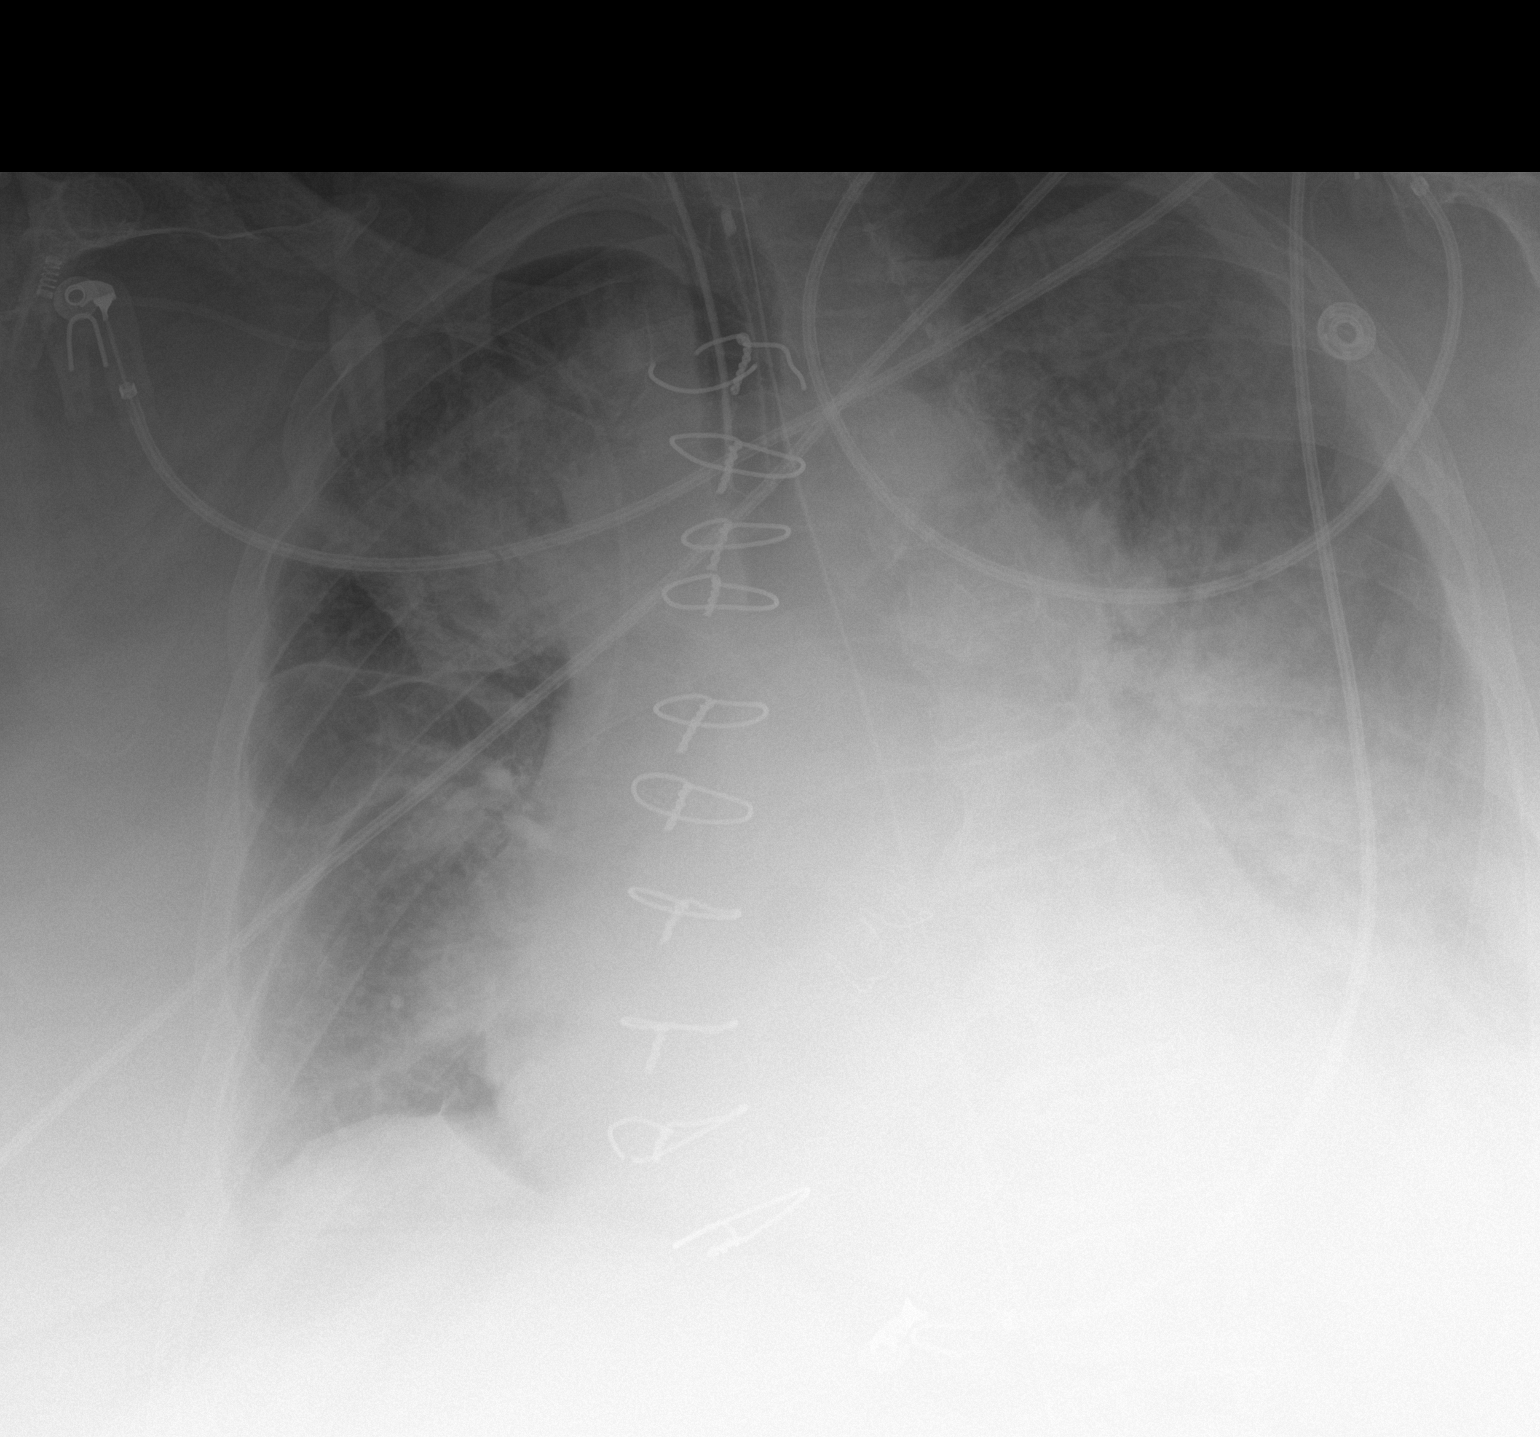

[1 of 1 positions shown; findings below may reference images not displayed]

FINDINGS: An endotracheal tube with tip 3 cm above the carina, right PICC line
with tip overlying the lower SVC and NG tube entering the stomach
with tip off the field of view again noted.

Diffuse bilateral airspace opacities are unchanged except for
increasing airspace disease/ consolidation in the right upper lung.

There is no evidence of pneumothorax.

Cardiomegaly and aortic valve replacement again noted.

Probable effusions again noted.
IMPRESSION: Increasing right upper lobe consolidation/atelectasis, otherwise
unchanged appearance of the chest with diffuse bilateral airspace
opacities.
